# Patient Record
Sex: Female | Born: 1935 | Race: White | Hispanic: No | Marital: Married
Health system: Southern US, Community
[De-identification: ages and names within clinical notes are randomized; demographics above are authoritative.]

---

## 2009-01-20 ENCOUNTER — Ambulatory Visit: Payer: Self-pay | Admitting: Pain Medicine

## 2009-02-12 ENCOUNTER — Ambulatory Visit: Payer: Self-pay | Admitting: Pain Medicine

## 2009-03-11 ENCOUNTER — Ambulatory Visit: Payer: Self-pay | Admitting: Pain Medicine

## 2009-03-16 ENCOUNTER — Ambulatory Visit: Payer: Self-pay | Admitting: Pain Medicine

## 2009-04-16 ENCOUNTER — Ambulatory Visit: Payer: Self-pay | Admitting: Pain Medicine

## 2009-04-22 ENCOUNTER — Ambulatory Visit: Payer: Self-pay | Admitting: Pain Medicine

## 2009-05-26 ENCOUNTER — Ambulatory Visit: Payer: Self-pay | Admitting: Pain Medicine

## 2009-06-03 ENCOUNTER — Ambulatory Visit: Payer: Self-pay | Admitting: Pain Medicine

## 2009-07-13 ENCOUNTER — Ambulatory Visit: Payer: Self-pay | Admitting: Pain Medicine

## 2009-07-20 ENCOUNTER — Ambulatory Visit: Payer: Self-pay | Admitting: Pain Medicine

## 2009-08-18 ENCOUNTER — Ambulatory Visit: Payer: Self-pay | Admitting: Pain Medicine

## 2009-08-30 ENCOUNTER — Inpatient Hospital Stay: Payer: Self-pay | Admitting: Internal Medicine

## 2009-09-15 ENCOUNTER — Ambulatory Visit: Payer: Self-pay | Admitting: Pain Medicine

## 2009-09-21 ENCOUNTER — Ambulatory Visit: Payer: Self-pay | Admitting: Pain Medicine

## 2009-10-01 ENCOUNTER — Ambulatory Visit: Payer: Self-pay | Admitting: Gastroenterology

## 2009-10-13 ENCOUNTER — Ambulatory Visit: Payer: Self-pay | Admitting: Pain Medicine

## 2009-10-19 ENCOUNTER — Ambulatory Visit: Payer: Self-pay | Admitting: Pain Medicine

## 2009-11-05 ENCOUNTER — Ambulatory Visit: Payer: Self-pay | Admitting: Pain Medicine

## 2009-11-16 ENCOUNTER — Ambulatory Visit: Payer: Self-pay | Admitting: Pain Medicine

## 2009-12-08 ENCOUNTER — Ambulatory Visit: Payer: Self-pay | Admitting: Pain Medicine

## 2010-09-10 IMAGING — CT CT CHEST W/ CM
2 series · 15 of 32 positions shown, 19 images · IV contrast (APPLIED)
Comparison: none

REASON FOR EXAM: eval dissection
COMMENTS:

[Series 4: soft tissue · axial · 0.69mm/px · 1 of 95 slices shown]
[im 8/95  mediastinal]
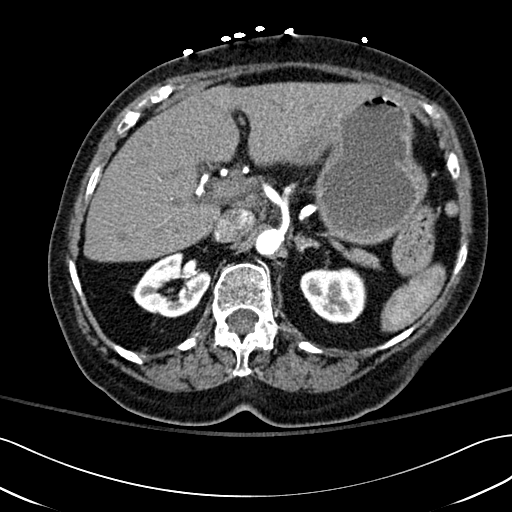

[Series 5: lung windows · axial · 0.69mm/px · z∈[+122,+353]mm · 14 of 92 slices shown, 18 images]
[im 8/92  mediastinal]
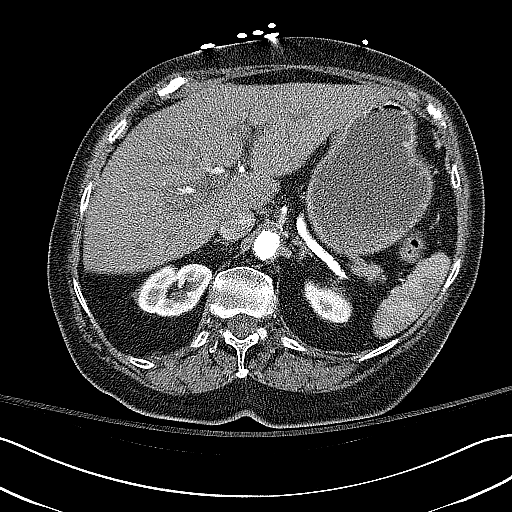
[im 8/92  lung]
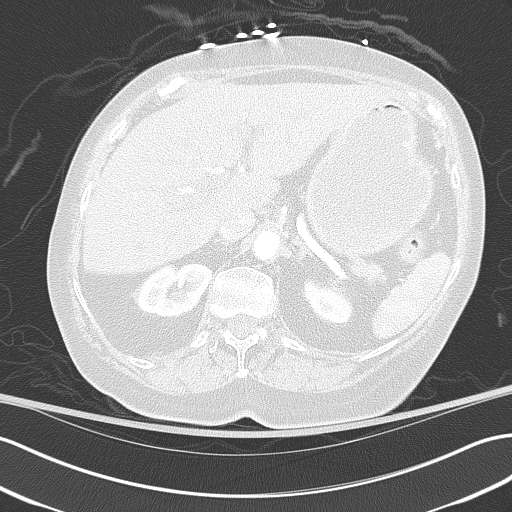
[im 15/92  lung]
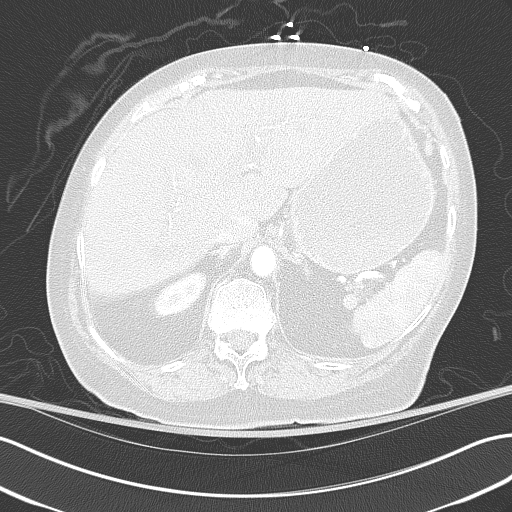
[im 22/92  lung]
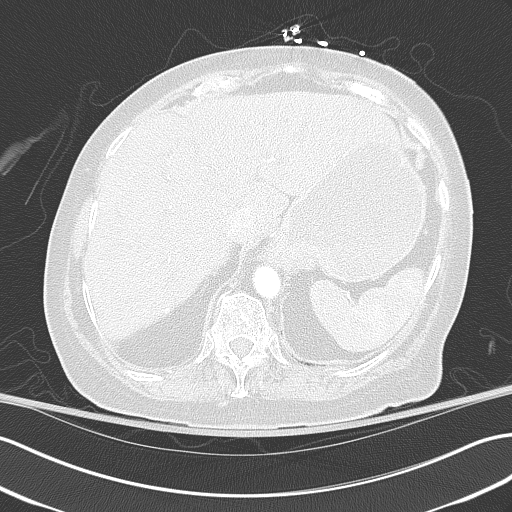
[im 29/92  lung]
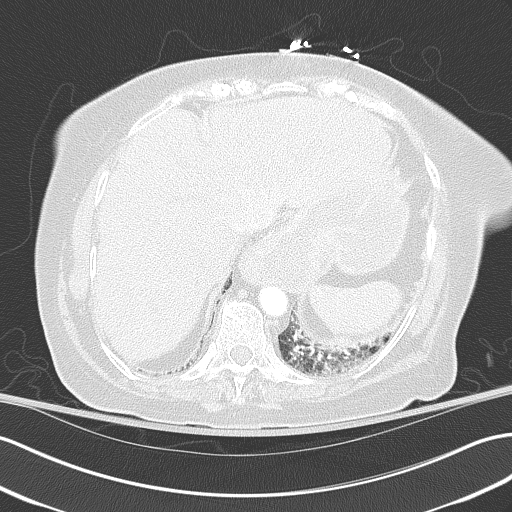
[im 36/92  mediastinal]
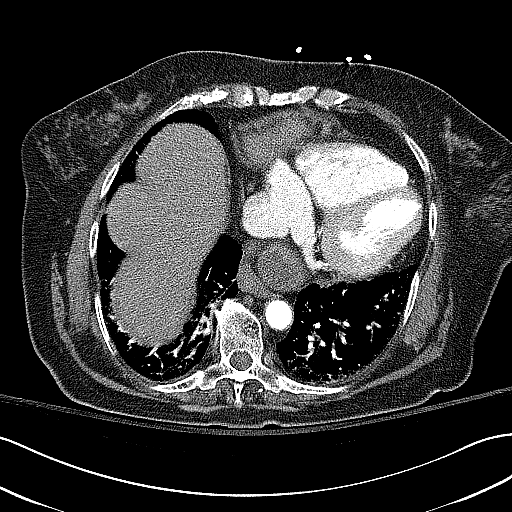
[im 36/92  lung]
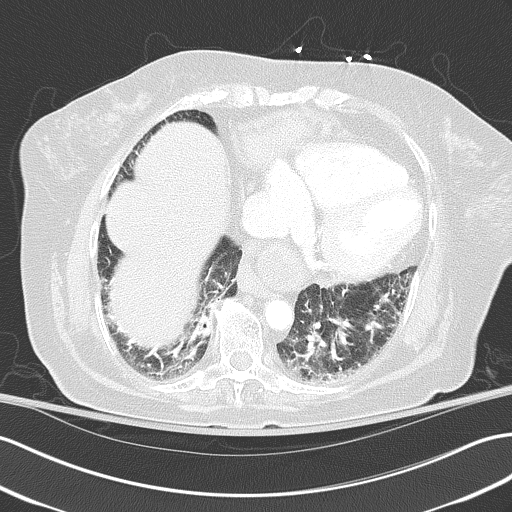
[im 42/92  lung]
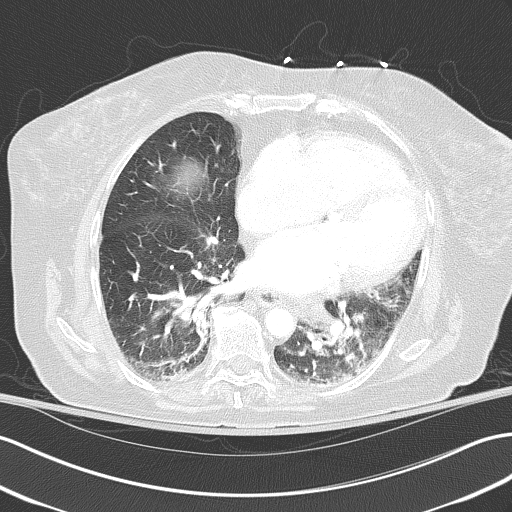
[im 43/92  lung]
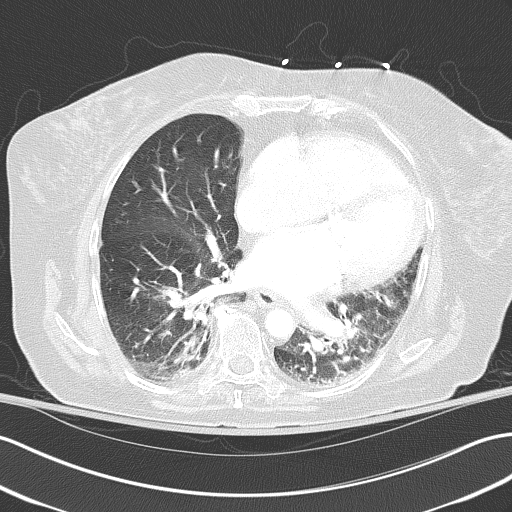
[im 46/92  lung]
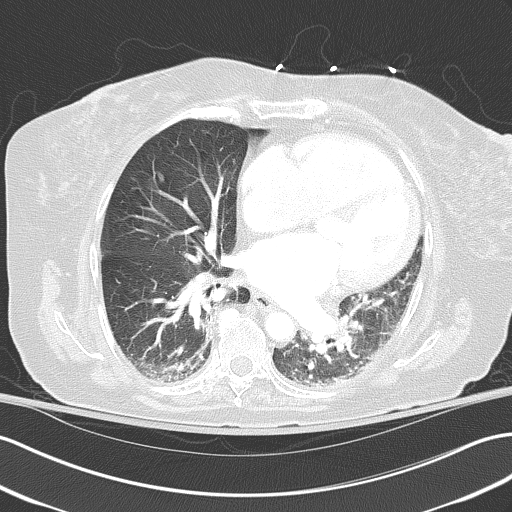
[im 50/92  mediastinal]
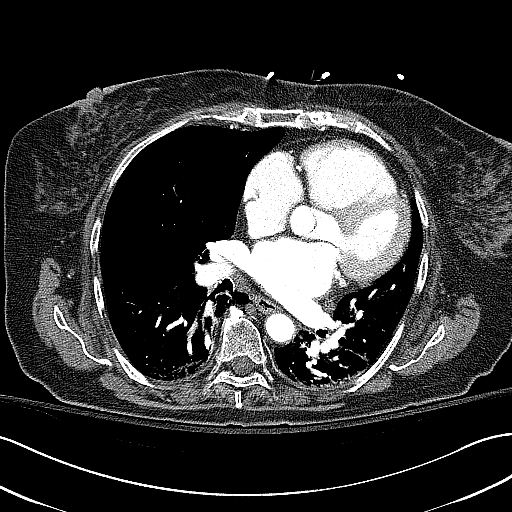
[im 50/92  lung]
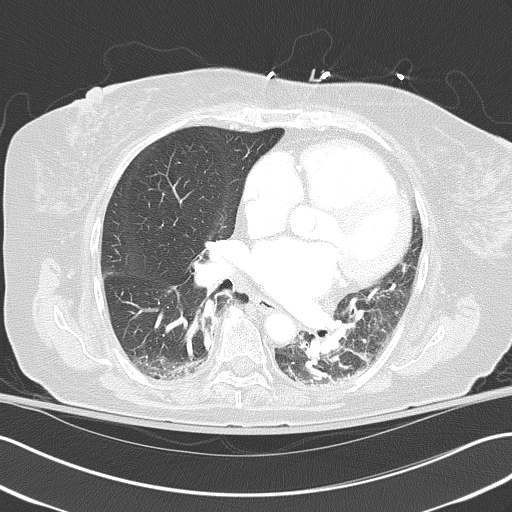
[im 57/92  lung]
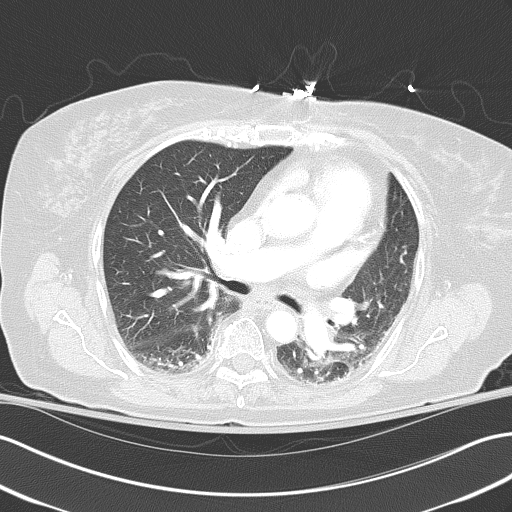
[im 64/92  lung]
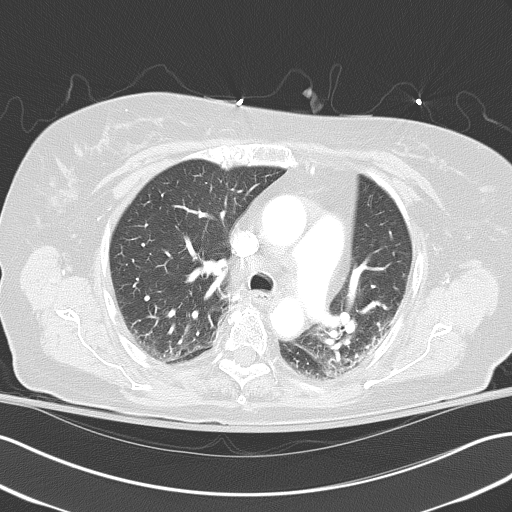
[im 71/92  lung]
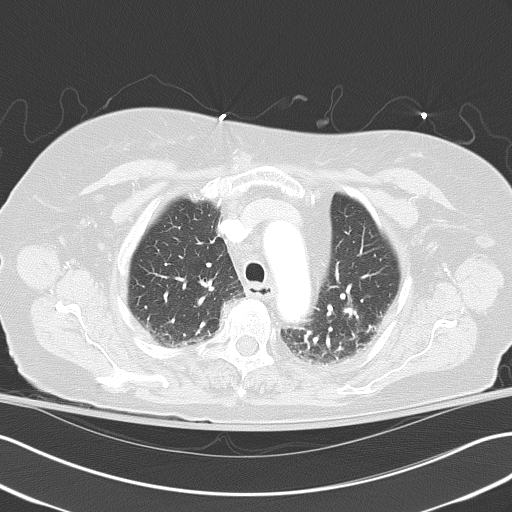
[im 78/92  mediastinal]
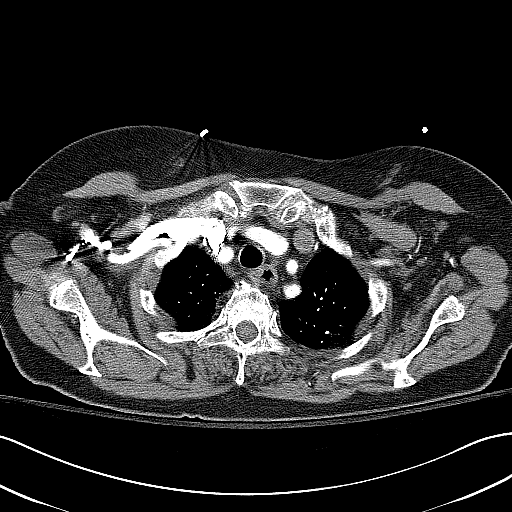
[im 78/92  lung]
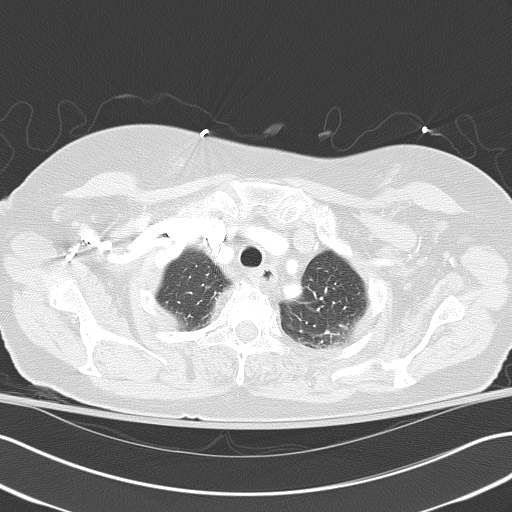
[im 85/92  lung]
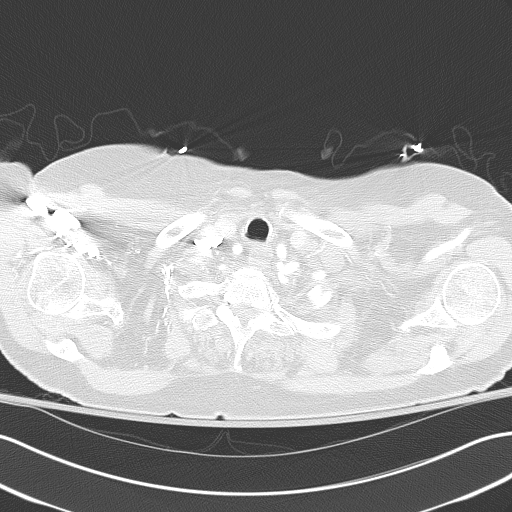

[15 of 32 positions shown; findings below may reference images not displayed]

PROCEDURE:     CT  - CT CHEST WITH CONTRAST  - August 30, 2009  [DATE]

RESULT:

Helical 3 mm sections were obtained from the thoracic inlet through the lung
bases status post intravenous administration of contrast. Evaluation of the
mediastinum and hilar regions and structures demonstrates multichamber
cardiac enlargement. There is no evidence of a thoracic aortic aneurysm nor
dissection. There is no evidence of filling defects within the main lobar or
segmental pulmonary arteries. The lung parenchyma demonstrates
hypoventilation within the lung bases as well as prominence of the
interstitial markings. The visualized upper abdominal viscera demonstrate no
gross abnormalities.
IMPRESSION: 1. No CT evidence of a thoracic aortic aneurysm nor dissection.
2. No evidence of pulmonary arterial embolic disease.
3. Hypoventilation within the lung bases as well as possibly a component of
pulmonary vascular congestion.

## 2010-09-11 IMAGING — CR DG CHEST 1V PORT
1 series · 1 of 1 positions shown · non-contrast
Comparison: none

REASON FOR EXAM: aspiration
COMMENTS:

[view not recorded]
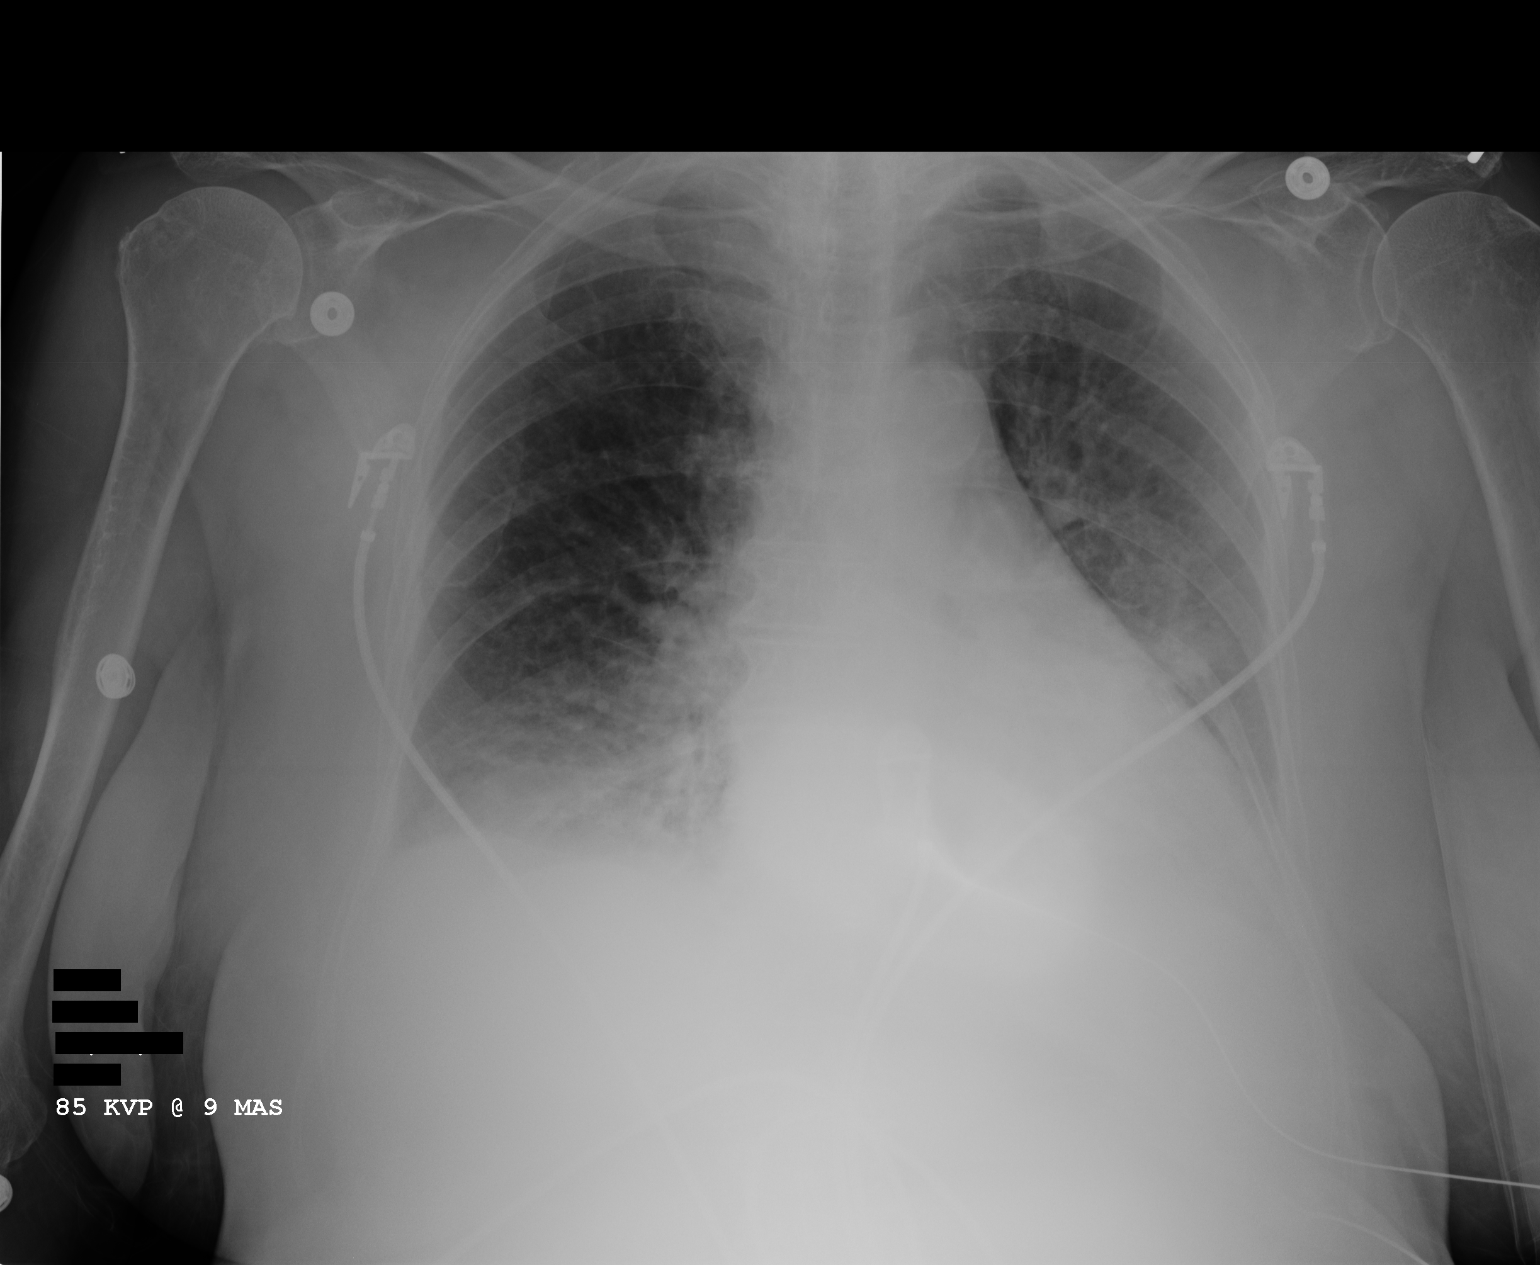

[1 of 1 positions shown; findings below may reference images not displayed]

PROCEDURE:     DXR - DXR PORTABLE CHEST SINGLE VIEW  - August 31, 2009  [DATE]

RESULT:     Comparison is made to a prior study dated 08/30/2009.

There has been increased thickening of the interstitial markings and
increased density in the region of the right lower lobe and to a lesser
extent the left lower lobe. The cardiac silhouette is enlarged. The
visualized bony skeleton is unremarkable.
IMPRESSION: 1. Interstitial infiltrate likely representing pulmonary edema. A
non-edematous interstitial infiltrate cannot be excluded if clinically
appropriate.
2. Surveillance evaluation is recommended if and as clinically warranted.

## 2012-08-18 ENCOUNTER — Inpatient Hospital Stay: Payer: Self-pay | Admitting: Orthopedic Surgery

## 2012-08-18 ENCOUNTER — Ambulatory Visit: Payer: Self-pay | Admitting: Orthopedic Surgery

## 2012-08-18 LAB — CBC
MCHC: 33 g/dL (ref 32.0–36.0)
Platelet: 186 10*3/uL (ref 150–440)
RBC: 3.3 10*6/uL — ABNORMAL LOW (ref 3.80–5.20)
RDW: 13.3 % (ref 11.5–14.5)

## 2012-08-18 LAB — CK TOTAL AND CKMB (NOT AT ARMC): CK-MB: 1.5 ng/mL (ref 0.5–3.6)

## 2012-08-18 LAB — COMPREHENSIVE METABOLIC PANEL
Albumin: 3.5 g/dL (ref 3.4–5.0)
Anion Gap: 7 (ref 7–16)
Bilirubin,Total: 0.4 mg/dL (ref 0.2–1.0)
Creatinine: 1.48 mg/dL — ABNORMAL HIGH (ref 0.60–1.30)
EGFR (African American): 36 — ABNORMAL LOW
SGOT(AST): 30 U/L (ref 15–37)
SGPT (ALT): 20 U/L (ref 12–78)

## 2012-08-18 LAB — URINALYSIS, COMPLETE
Blood: NEGATIVE
Ketone: NEGATIVE
Leukocyte Esterase: NEGATIVE
Nitrite: NEGATIVE
Protein: NEGATIVE
RBC,UR: 1 /HPF (ref 0–5)
Specific Gravity: 1.017 (ref 1.003–1.030)
WBC UR: <1 /HPF (ref 0–5)

## 2012-08-19 LAB — BASIC METABOLIC PANEL
Anion Gap: 4 — ABNORMAL LOW (ref 7–16)
BUN: 40 mg/dL — ABNORMAL HIGH (ref 7–18)
Calcium, Total: 7.9 mg/dL — ABNORMAL LOW (ref 8.5–10.1)
Chloride: 106 mmol/L (ref 98–107)
Creatinine: 1.51 mg/dL — ABNORMAL HIGH (ref 0.60–1.30)
EGFR (Non-African Amer.): 30 — ABNORMAL LOW
Glucose: 182 mg/dL — ABNORMAL HIGH (ref 65–99)
Potassium: 4.4 mmol/L (ref 3.5–5.1)
Sodium: 141 mmol/L (ref 136–145)

## 2012-08-19 LAB — PROTIME-INR: Prothrombin Time: 14 secs (ref 11.5–14.7)

## 2012-08-19 LAB — APTT: Activated PTT: 27.5 secs (ref 23.6–35.9)

## 2012-08-20 LAB — BASIC METABOLIC PANEL
BUN: 24 mg/dL — ABNORMAL HIGH (ref 7–18)
Calcium, Total: 7.6 mg/dL — ABNORMAL LOW (ref 8.5–10.1)
Chloride: 106 mmol/L (ref 98–107)
Co2: 26 mmol/L (ref 21–32)
Creatinine: 1.12 mg/dL (ref 0.60–1.30)
EGFR (African American): 50 — ABNORMAL LOW
EGFR (Non-African Amer.): 43 — ABNORMAL LOW
Potassium: 4 mmol/L (ref 3.5–5.1)
Sodium: 139 mmol/L (ref 136–145)

## 2012-08-20 LAB — HEMOGLOBIN: HGB: 6.5 g/dL — ABNORMAL LOW (ref 12.0–16.0)

## 2012-08-20 LAB — TSH: Thyroid Stimulating Horm: 0.181 u[IU]/mL — ABNORMAL LOW

## 2014-05-06 DEATH — deceased

## 2014-09-26 NOTE — H&P (Signed)
PATIENT NAME:  [name], [name] MR#:  [id number] DATE OF BIRTH:  Apr 11, 1920  DATE OF ADMISSION:  08/18/2012  CHIEF COMPLAINT:  Right hip pain.   HISTORY OF PRESENT ILLNESS:  This is a 81 year old female who fell and sustained injury to her right hip. She denies any loss of consciousness or syncopal episodes.  She denies any pain in her left lower extremity or upper extremities.  She did not strike her head.  She complains of pain in her right hip rated as a 7 out of 10, sharp in nature, exacerbated by movement of the hip and relieved by rest.  The patient also has a history of scoliosis with an apparent leg length discrepancy to the right leg for which she wears a shoe lift.  She also has a history of a TIA with some mild right-sided weakness.   PAST MEDICAL HISTORY:  Hypertension, TIA, coronary artery disease.   PAST SURGICAL HISTORY:  Hiatal hernia repair, ACDF, thyroidectomy, hysterectomy, carotid stent placement.   MEDICATIONS:  Tylenol, Protonix, oxycodone, morphine, lisinopril, levothyroxine, Lasix, Coreg, calcium and vitamin D.   ALLERGIES:  No known drug allergies.   SOCIAL HISTORY:  The patient denies any alcohol or tobacco use.   FAMILY HISTORY:  Noncontributory.   REVIEW OF SYSTEMS:   No chest pain.  No shortness of breath.   PHYSICAL EXAMINATION: GENERAL:  The patient well-appearing, well-nourished in no acute distress.  EXTREMITIES:  Examination of the right hip reveals overlying skin to be intact without erythema.  She has tenderness to palpation anteriorly.  No lateral tenderness.  She has a positive log roll.  She has limited flexion-extension secondary to pain.  She has full range of motion of the ankle and all digits.  She has 5 out of 5 strength on her tibialis anterior, EHL and gastrocs.  Intact sensation distally, good distal pulses.  She has a small skin abrasion on her right knee as well as her left shin.  Examination of the left hip was performed in a similar manner  and is free of any abnormalities.  Examination of the right arm reveals a small area of ecchymosis about the mid humerus, approximately 3 cm in size, which is tender to palpation.  There is no bony tenderness.  Full range of motion of the shoulder and elbow.  Full strength.  No instability.    RADIOLOGIC IMAGES:  X-rays of the pelvis and right hip were obtained and reviewed which reveals a right hip intertrochanteric fracture.   IMPRESSION:  A 81 year old female with right hip fracture.   PLAN:  The patient will be admitted to the hospital with consultation for preoperative clearance by the hospitalist group.  Pending clearance she will be taken to the operating room for surgical fixation.  She will be nothing by mouth after midnight.  Consent will be obtained.     ____________________________ [place]had E. Karletta Millay, MD ces:ea D: 08/18/2012 22:15:25 ET T: 08/19/2012 03:26:13 ET JOB#: [id number]  cc: [place]had E. Rudy Luhmann, MD, <Dictator> [place] E Sai Zinn MD ELECTRONICALLY SIGNED 08/19/2012 7:56

## 2014-09-26 NOTE — Op Note (Signed)
PATIENT NAME:  [name], [name] MR#:  [id number] DATE OF BIRTH:  Jun 10, 1919  DATE OF PROCEDURE:  08/19/2012  PREOPERATIVE DIAGNOSIS: Right hip intertrochanteric fracture.   POSTOPERATIVE DIAGNOSIS: Right hip intertrochanteric fracture.   PROCEDURE: Intramedullary nailing of right hip intertrochanteric fracture.   IMPLANTS: Synthes 11 x 170 TFN with 100 mm hip screw and 34 mm distal locking screw.   SURGEON: [place]had Taylorann Tkach, MD   ANESTHESIA: General with spinal.   FLUIDS: Per anesthesia record.   BLOOD LOSS: 200.   URINE: 150.   COMPLICATIONS: None.   SPECIMENS: None.   INDICATIONS: This is a 81 year old female who fell and sustained injury to her right hip. She was found to have an intertrochanteric fracture. She was indicated for operative intervention. She was seen by the hospitalist, who cleared the patient for surgery. All risks, benefits, and alternatives of the procedure were explained at length to the patient, and she wished to proceed. Informed consent was obtained.   DESCRIPTION OF PROCEDURE: The patient was seen in the preoperative holding area where the operative site was marked by an operative surgeon. Preoperative antibiotics were administered. The patient was taken to the operating room where she a spinal was administered. The anesthesia team felt that general anesthesia was also warranted, so she underwent general anesthesia with an LMA. We then transitioned the patient to the fracture table where all bony prominences were well padded. The right lower extremity was placed into a fracture boot with adequate padding. The left lower extremity was flexed, abducted and externally rotated out of the way of the C-arm. With some in-line traction and gentle closed reduction maneuvers, we aligned the fracture in the AP and lateral planes. The right hip was then prepped and draped in usual sterile manner, and a surgical timeout was performed. To begin, I made an incision just proximal to  the tip of the greater trochanter and advanced a guidewire through the extremely comminuted greater trochanter into the femoral shaft distally. We utilized the opening reamer. The 11 x 170 nail was then advanced by hand down to the appropriate level for placement of our hip screw. The hip screw guide was then advanced and a second incision was made. The cannula was advanced to the bone, and the guide pin for the hip screw was placed into approximately the center-center position with the pin slightly inferior and slightly posterior. The pin was measured and found to be 100 mm. The hip screw was advanced. We then compressed across the fracture site with the corresponding cannula and guide. This closed down the fracture site by a few millimeters. We then locked the hip screw proximally. Finally our distal locking screw was placed by using the corresponding cannula and guide. The screw length was measured and found to be 34 mm, and the corresponding screw was placed through the cannula without difficulty. Final radiographs were obtained and reviewed and found to be satisfactory. The incisions were thoroughly irrigated. The IT band was closed, followed by the skin with 2-0 Vicryl and staples. Sterile dressings were applied, drapes were removed. The patient was awakened from general anesthesia without complication and transported to the recovery room in stable condition.   POSTOPERATIVE PLAN: The patient will be toe touch weight-bearing to the right lower extremity.  She will receive 2 doses of postoperative antibiotics and be placed on Lovenox for DVT prophylaxis.   ____________________________ [place]had E. Mckala Pantaleon, MD ces:cb D: 08/19/2012 14:31:42 ET T: 08/19/2012 23:52:32 ET JOB#: [id number]  cc: [place]had E.  Katrinka Blazing, MD, <Dictator> [place] E Skylene Deremer MD ELECTRONICALLY SIGNED 09/20/2012 13:10

## 2014-09-26 NOTE — Discharge Summary (Signed)
PATIENT NAME:  [name] MR#:  [id number] DATE OF BIRTH:  08/28/1919  DATE OF ADMISSION:  08/18/2012 DATE OF DISCHARGE: 08/22/2012   ADMITTING DIAGNOSIS: Right intertrochanteric femur fracture.   DISCHARGE DIAGNOSIS: Right intertrochanteric femur fracture.   OPERATION: On 08/19/2012, she had a right hip intramedullary nail.   SURGEON: [place] E. Smith, MD  ANESTHESIA: Spinal.   ESTIMATED BLOOD LOSS: 200 mL.   COMPLICATIONS: None.   IMPLANTS: Synthes 11 x 170 TFN with 100 mm hip screw and 34 mm distal locking screw.   HISTORY: The patient is a 81 year old female who sustained an injury to her right hip on 08/18/2012. She was transported to [facility] ED and found to have an intertrochanteric hip fracture. She was cleared for surgery and consented to surgery.   PHYSICAL EXAMINATION:  GENERAL: Well appearing, well nourished, in no acute distress.  EXTREMITIES: Right hip skin intact without erythema. She is tender to palpation anteriorly. No lateral tenderness. She has positive logroll. She has limited flexion and extension secondary to pain. She has full range of motion of the ankle and all digits. She has 5/5 strength of tibialis anterior, EHL and gastrocnemius. Intact sensation distally. Good distal pulses. She has a small skin abrasion on her right knee and left shin. Left hip is free of any abnormalities. The right arm reveals a small area of ecchymosis about the mid humerus, approximately 3 cm in size, which is tender to palpation. There is no bony tenderness. Full range of motion of shoulder and elbow. Full strength. No instability.   HOSPITAL COURSE: After admission on 08/18/2012, she underwent surgery the next day, 08/19/2012. On postoperative day one, 08/20/2012, she had a hemoglobin of 6.5, and 2 units of packed red blood cells were transfused for acute blood loss anemia. Physical therapy was begun with toe-touch weight-bearing on the right lower extremity; however, she did not  tolerate therapy very well. On postoperative day two, 08/21/2012, her hemoglobin was 7.0 after 2 units of packed red cells. She was complaining of right ankle pain. She had x-rays which were negative for fracture. On postoperative day three, 08/22/2012, she was stable and ready for discharge.   CONDITION AT DISCHARGE: Stable.   DISPOSITION: The patient was sent to rehab.   DISCHARGE INSTRUCTIONS:  1. The patient will follow up with [facility] in 2 weeks for staple removal.  2. She will do physical therapy and be toe-touch weight-bearing on the right lower extremity.  3. The patient's diet is regular.  4. TED hose will be used, knee-high bilaterally.  5. Dressing can be changed once daily on an as-needed basis.   DISCHARGE MEDICATIONS:  1. Oxycodone 5 mg 1 tab p.o. 4 times daily as needed for pain.  2. Ascorbic acid 1000 mg 1 tab p.o. once daily.  3. Lasix 20 mg 1/2-tab p.o. once daily.  4. Lisinopril 5 mg 1 tab p.o. once daily.  5. Levothyroxine 1 tab p.o. once daily.  6. Calcium plus vitamin D 600/200 one tab p.o. once daily.  7. Coreg 6.25 mg 1 tab p.o. b.i.d.  8. Protonix 40 mg delayed release tablet 1 tab p.o. twice daily.  9. Morphine 15 mg 1 tab p.o. twice daily.  10. Acetaminophen 325 one tab p.o. q.4 hours p.r.n. pain or temperature greater than 100.4.  11. Tramadol 50 mg 1 tab p.o. q.4 hours p.r.n. pain.  12. Magnesium hydroxide 8% oral 30 mL p.o. twice daily p.r.n. constipation.  13. Bisacodyl 10 mg rectal suppository  1 suppository rectal once daily as needed for constipation.  14. Docusate/senna 50 mg/8.6 mg tablet 1 tab p.o. b.i.d.  15. Enoxaparin 40 mg injectable 1 injection subcutaneous once daily for 14 days.     ____________________________ Kaleeya Hancock M. Haskel KhanBerndt, NP amb:OSi D: 08/22/2012 11:52:21 ET T: 08/22/2012 12:10:18 ET JOB#: [id number]  cc: Eleanor Dimichele M. Haskel KhanBerndt, NP, <Dictator> Burt EkAPRIL M Amauris Debois FNP ELECTRONICALLY SIGNED 09/06/2012 9:58

## 2014-09-26 NOTE — Consult Note (Signed)
PATIENT NAME:  [name], [name] MR#:  [id number] DATE OF BIRTH:  April 01, 1920  REFERRING MD:  Dr. Katrinka Blazing.  ATTENDING MD:  Dr. Katrinka Blazing.  CARDIOLOGY:  Dr. Darrold Junker, in the past.  REASON FOR CONSULTATION:   Preop clearance.   HISTORY OF PRESENT ILLNESS: The patient is a 81 year old Caucasian female who is living alone. Is reporting that while trying to walk to the mailbox she tripped over and fell and fractured her hip. The patient was admitted to Dr. Michaelle Copas service, who consulted the [facility] Team for preop clearance. The patient admits a history of coronary artery disease and stent placement in the past, and not recently seen by cardiologist. She lives alone, and denies any complaints of chest pain or shortness of breath, and doing fine. Denies any recent history of chest pain, palpitations, shortness of breath or fluid overload. She has a chronic history of congestive heart failure and takes a small dose of Lasix. Her previous echocardiogram was done in the year 2011 which has revealed a left ventricular ejection of 55%. The patient is feeling dry during my examination, complaining of a dry mouth. Denies any other complaints. Hip pain is well-controlled. She lives alone and uses a wheelchair most of the time, but she can walk a few steps. Lives on 2 liters of oxygen every nighttime. Denies any complaints other than hip pain during my examination.   PAST MEDICAL HISTORY:  1.  History of congestive heart failure according to echocardiogram 08/05/2009; left ventricular ejection fraction of 55%.  2.  There is a history of esophageal stricture.  3.  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  4.  Coronary artery disease status post stent placement in the year 2002.  5.  Hypertension.  6.  Hyperlipidemia.  7.  Hypothyroidism, status post thyroidectomy.  8.  Ischemic heart disease, with a history of stent. 9.  Degenerative joint disease.  10. Peripheral neuropathy.   PAST SURGICAL HISTORY:  Thyroidectomy in 1995, cardiac stent placement, lumbar  back surgery, cervical spine surgery, and a history of nephrectomy.  PSYCHOSOCIAL HISTORY: Lives alone. Denies smoking, alcohol or illicit drug use. Uses  2 liters of oxygen at bedtime.   FAMILY HISTORY: Diabetes runs in her family.   ALLERGIES: She has no known drug allergies.   HOME MEDICATIONS: Protonix 40 mg 2 times a day, oxycodone 5 mg 4 times a day, morphine 15 mg 1 tablet p.o. b.i.d., lisinopril 5 mg once daily, levothyroxine 100 mcg once daily, Lasix 20 mg half-tablet once a day, Coreg 6.25 mg twice a day, calcium with vitamin D 1 tablet once a day, ascorbic acid 1000 mg once a day.   REVIEW OF SYSTEMS:  CONSTITUTIONAL: Denies any weakness, tiredness, weight loss or weight gain.  HEENT: Denies any headache, blurry vision, but complaining of hard of hearing, which is chronic in nature. Denies any nasal discharge, infection or inflammation. No ear pain.  NECK: No neck pain swelling. She has a thyroidectomy done in the past.  LUNGS: Denies any history of asthma. Mild COPD; lives on 2 liters of oxygen at bedtime. Denies any cough, hemoptysis.  CARDIOVASCULAR: Has a history of coronary artery disease. Denies any chest pain, shortness of breath, palpitations.  GASTROINTESTINAL: Denies any nausea or vomiting, diarrhea, constipation, melena,  hematemesis.  NEUROLOGIC: Old history of stroke is present, with left-sided weakness. Denies any vertigo, ataxia, dementia.  MUSCULOSKELETAL: Has chronic history of arthritis but denies any joint swelling except for the hip pain as she has  a fractured hip. Denies any gout, rheumatoid arthritis.  ENDOCRINOLOGY: Chronic history of diabetes mellitus, hypothyroidism. Denies any polyuria, nocturia, polyphagia, polydipsia.  SKIN: No rashes, lesions. No skin cancers.  PSYCHIATRIC: Denies any anxiety, depression, ADD, OCD.  VITAL SIGNS: Temperature 98.4, pulse 76 per minute, blood pressure 156/62, pulse  oximetry is 95 to 97%, respiratory rate 18 per minute.  GENERAL APPEARANCE: Nontraumatic. No acute distress, but really hard of hearing. Moderately-built and moderately-nourished.   HEENT: Normocephalic, atraumatic. Pupils are equal and reactive to light and accommodation. No conjunctival injection. No scleral icterus. No postnasal drip. No sinus tenderness. Tympanic membranes are intact.  NECK: Supple. No JVD. No thyromegaly.  LUNGS: Clear to auscultation bilaterally. No accessory muscle usage. No anterior chest wall tenderness.  CARDIAC: S1, S2 normal. Regular rate and rhythm; +2 ejection systolic murmur. No peripheral edema.  GASTROINTESTINAL: Soft. Bowel sounds are positive in all 4 quadrants. Nontender, nondistended. No masses felt. No hepatosplenomegaly.  NEUROLOGIC: Awake, alert, oriented x 3. Hard of hearing. Cranial nerves II-XII are grossly intact. Sensory is intact. Reflexes 2+.  SKIN: No rashes, lesions, acne. Normal turgor. Warm to touch.  EXTREMITIES: Right hip is externally rotated, tender to touch. Left hip and left knee joint are intact. Motor and sensory are intact on the left lower extremity.  No cyanosis. No clubbing. No edema.   PSYCHIATRIC: Normal mood and affect.   LABS AND IMAGING STUDIES: Right hip x-ray has revealed a displaced intertrochanteric fracture of the right hip.   CHEST X-RAY: No acute findings.   PELVIC X-RAY: Intertrochanteric fracture of the right hip.  Twelve-lead EKG has revealed normal sinus rhythm, with normal PR and QRS interval, right bundle branch block. Nonspecific ST-T wave changes.   Glucose is 182, BUN is 40, creatinine is 1.51.   Sodium is 141, potassium 4.4, chloride 106. Anion gap is 4, serum osmolality 296, calcium is 7.9. CK total 138, CPK-MB 1.5. Troponin T less than 0.02.   WBC 14.8, hemoglobin 10.2, hematocrit 30.9, platelet count 186. PT 13.3, INR 1.0.   Urinalysis: Glucose negative, bilirubin negative, ketones negative, specific  gravity 1.017. Nitrites and leukocyte esterase negative. Hyaline casts are 10.   ASSESSMENT AND PLAN: A 81 year old Caucasian female was admitted to Dr. Michaelle Copas service with a right hip fracture. A medical consult was placed for preoperative clearance.   PLAN: 1.  Acute kidney injury, prerenal as the BUN/creatinine ratio is greater than 20. The patient is on IV fluids. We will bolus her with 500 mL and continue IV fluids. We will monitor her renal function closely and avoid nephrotoxins. If necessary we will consider Nephrology consult.  2.  The patient has history of diabetes mellitus, but not on any medications. We will start her on insulin, sliding scale.  3.  History of hypertension and coronary artery disease, status post stent placement: The patient was on Coreg at home. Since she is n.p.o. we will give her Lopressor 2.5 mg IV.  4.  History of hypothyroidism, status post thyroidectomy: As the patient is n.p.o. we will give her Synthroid in the IV form and check TSH in the a.m.  5.  History of esophageal stricture: Continue IV Protonix while patient is n.p.o.  6. Right hip fracture: Pain management per Orthopedics, and provide the patient with GI prophylaxis. The patient is now medically optimized for preoperative clearance in a.m.  The plan is to monitor her renal function closely and continue IV fluids while watching for symptoms and signs of fluid overload.  The patient's Lasix is on hold. The patient has a chronic history of congestive heart failure, which is diastolic in nature, and Lasix is on hold.   The plan of care was discussed with the patient. She is aware of the plan.   Thank you, Dr. Katrinka BlazingSmith, for allowing the Hospitalist Team to take care of this patient medically.   Total time spent on this consult is 45 minutes.     ____________________________ Ramonita LabAruna Kelsen Celona, MD ag:dm D: 08/19/2012 03:00:00 ET T: 08/19/2012 14:53:51 ET JOB#: [id number]  cc: Ramonita LabAruna Tannen Vandezande, MD, <Dictator> Ramonita LabARUNA  Classie Weng MD ELECTRONICALLY SIGNED 08/22/2012 0:05
